# Patient Record
Sex: Male | Born: 1965 | Race: White | Hispanic: No | Marital: Married | Smoking: Current every day smoker
Health system: Southern US, Community
[De-identification: ages and names within clinical notes are randomized; demographics above are authoritative.]

## PROBLEM LIST (undated history)

## (undated) HISTORY — PX: BACK SURGERY: SHX140

---

## 2019-06-18 ENCOUNTER — Encounter (HOSPITAL_COMMUNITY): Payer: Self-pay | Admitting: Emergency Medicine

## 2019-06-18 ENCOUNTER — Emergency Department (HOSPITAL_COMMUNITY): Payer: BC Managed Care – PPO

## 2019-06-18 ENCOUNTER — Emergency Department (HOSPITAL_COMMUNITY)
Admission: EM | Admit: 2019-06-18 | Discharge: 2019-06-18 | Disposition: A | Discharge status: Home / Self Care | Payer: BC Managed Care – PPO | Attending: Emergency Medicine | Admitting: Emergency Medicine

## 2019-06-18 ENCOUNTER — Other Ambulatory Visit: Payer: Self-pay

## 2019-06-18 DIAGNOSIS — F17200 Nicotine dependence, unspecified, uncomplicated: Secondary | ICD-10-CM | POA: Diagnosis not present

## 2019-06-18 DIAGNOSIS — R079 Chest pain, unspecified: Secondary | ICD-10-CM | POA: Diagnosis not present

## 2019-06-18 DIAGNOSIS — R42 Dizziness and giddiness: Secondary | ICD-10-CM | POA: Diagnosis not present

## 2019-06-18 DIAGNOSIS — M549 Dorsalgia, unspecified: Secondary | ICD-10-CM | POA: Insufficient documentation

## 2019-06-18 LAB — BASIC METABOLIC PANEL
Anion gap: 11 (ref 5–15)
BUN: 12 mg/dL (ref 6–20)
CO2: 24 mmol/L (ref 22–32)
Calcium: 8.8 mg/dL — ABNORMAL LOW (ref 8.9–10.3)
Chloride: 106 mmol/L (ref 98–111)
Creatinine, Ser: 1 mg/dL (ref 0.61–1.24)
GFR calc Af Amer: >60 mL/min (ref >60)
GFR calc non Af Amer: >60 mL/min (ref >60)
Glucose, Bld: 107 mg/dL — ABNORMAL HIGH (ref 70–99)
Potassium: 3.9 mmol/L (ref 3.5–5.1)
Sodium: 141 mmol/L (ref 135–145)

## 2019-06-18 LAB — CBC
HCT: 47.9 % (ref 39.0–52.0)
Hemoglobin: 15.9 g/dL (ref 13.0–17.0)
MCH: 30.6 pg (ref 26.0–34.0)
MCHC: 33.2 g/dL (ref 30.0–36.0)
MCV: 92.3 fL (ref 80.0–100.0)
Platelets: 258 10*3/uL (ref 150–400)
RBC: 5.19 MIL/uL (ref 4.22–5.81)
RDW: 13 % (ref 11.5–15.5)
WBC: 13.6 10*3/uL — ABNORMAL HIGH (ref 4.0–10.5)
nRBC: 0 % (ref 0.0–0.2)

## 2019-06-18 LAB — TROPONIN I (HIGH SENSITIVITY)
Troponin I (High Sensitivity): 4 ng/L (ref <18)
Troponin I (High Sensitivity): 4 ng/L (ref <18)

## 2019-06-18 MED ORDER — IOHEXOL 350 MG/ML SOLN
100.0000 mL | Freq: Once | INTRAVENOUS | Status: AC | PRN
  Administered 2019-06-18: 100 mL via INTRAVENOUS

## 2019-06-18 MED ORDER — SODIUM CHLORIDE 0.9% FLUSH: 3.0000 mL | Freq: Once | INTRAVENOUS | Status: DC

## 2019-06-18 MED ORDER — SODIUM CHLORIDE 0.9 % IV BOLUS
1000.0000 mL | Freq: Once | INTRAVENOUS | Status: AC
  Administered 2019-06-18: 09:00:00 1000 mL via INTRAVENOUS

## 2019-06-18 MED ORDER — ASPIRIN 81 MG PO CHEW
324.0000 mg | CHEWABLE_TABLET | Freq: Once | ORAL | Status: AC
  Administered 2019-06-18: 324 mg via ORAL
  Filled 2019-06-18: qty 4

## 2019-06-18 NOTE — Discharge Instructions (Addendum)
You were evaluated in the Emergency Department and after careful evaluation, we did not find any emergent condition requiring admission or further testing in the hospital.  Your exam/testing today was overall reassuring.  Your second blood test did not show any heart damage.  As discussed, we recommend close follow-up with your primary care doctor and/or cardiology to discuss need for outpatient stress testing, ideally within the next 2 weeks.  Please return to the Emergency Department if you experience any worsening of your condition.  We encourage you to follow up with a primary care provider.  Thank you for allowing [place] to be a part of your care.

## 2019-06-18 NOTE — ED Notes (Signed)
Pt reports that he has had CP located in his central/left chest that radiates to his Lt neck & his Left fingers went numb once this morning. Pt has a high pain tolerance (per pt) & has had this happen in the past. He came to ED with pain rated in his chest 2/10 & his neck (which bothers him more than his chest) rated a 3/10 in pain. Pt is A/Ox4, ambulates well, independent at baseline, only reported cardiac history he reports is his mother having open heart valve surgery, plus pt does endorse smoking one pack of cigarettes a day.

## 2019-06-18 NOTE — ED Notes (Signed)
Pt waited for EDP, this RN spoke with pt about his visit summary & pt left having no questions unanswered.

## 2019-06-18 NOTE — ED Provider Notes (Signed)
[facility] Emergency Department Provider Note MRN:  [id number]  Arrival date & time: 06/18/19     Chief Complaint   Chest pain History of Present Illness   Jesse Beard is a 54 y.o. year-old male with a history of tobacco abuse presenting to the ED with chief complaint of chest pain.  Chest pain that began yesterday afternoon.  Described as a sharp pain, occasionally radiating to the back, left side neck, left arm.  Sudden onset while at rest, moderate to severe.  Continued this morning, associated with lightheadedness, palpitations, shortness of breath.  Denies nausea or vomiting.  Endorsing paresthesias and occasional numbness to the left index finger.  Review of Systems  A complete 10 system review of systems was obtained and all systems are negative except as noted in the HPI and PMH.   Patient's Health History   History reviewed. No pertinent past medical history.    No family history on file.  Social History   Socioeconomic History  . Marital status: Married    Spouse name: Not on file  . Number of children: Not on file  . Years of education: Not on file  . Highest education level: Not on file  Occupational History  . Not on file  Tobacco Use  . Smoking status: Current Every Day Smoker  Substance and Sexual Activity  . Alcohol use: Not on file  . Drug use: Not on file  . Sexual activity: Not on file  Other Topics Concern  . Not on file  Social History Narrative  . Not on file   Social Determinants of Health   Financial Resource Strain:   . Difficulty of Paying Living Expenses:   Food Insecurity:   . Worried About Charity fundraiser in the Last Year:   . Arboriculturist in the Last Year:   Transportation Needs:   . Film/video editor (Medical):   Marland Kitchen Lack of Transportation (Non-Medical):   Physical Activity:   . Days of Exercise per Week:   . Minutes of Exercise per Session:   Stress:   . Feeling of Stress :   Social  Connections:   . Frequency of Communication with Friends and Family:   . Frequency of Social Gatherings with Friends and Family:   . Attends Religious Services:   . Active Member of Clubs or Organizations:   . Attends Archivist Meetings:   Marland Kitchen Marital Status:   Intimate Partner Violence:   . Fear of Current or Ex-Partner:   . Emotionally Abused:   Marland Kitchen Physically Abused:   . Sexually Abused:      Physical Exam   Vitals:   06/18/19 0845 06/18/19 0900  BP: 105/70 114/76  Pulse: (!) 52 (!) 55  Resp: 10 15  Temp:    SpO2: 99% 99%    CONSTITUTIONAL: Well-appearing, NAD NEURO:  Alert and oriented x 3, normal and symmetric strength, normal speech, no visual field cuts, no neglect, no aphasia, subjective decreased sensation to the fingers of the left hand EYES:  eyes equal and reactive ENT/NECK:  no LAD, no JVD CARDIO: Regular rate, well-perfused, normal S1 and S2 PULM:  CTAB no wheezing or rhonchi GI/GU:  normal bowel sounds, non-distended, non-tender MSK/SPINE:  No gross deformities, no edema SKIN:  no rash, atraumatic PSYCH:  Appropriate speech and behavior  *Additional and/or pertinent findings included in MDM below  Diagnostic and Interventional Summary    EKG Interpretation  Date/Time:  Monday June 18 2019 08:10:22 EDT Ventricular Rate:  64 PR Interval:  142 QRS Duration: 87 QT Interval:  412 QTC Calculation: 426 R Axis:   87 Text Interpretation: Sinus rhythm Confirmed by Kennis Carina [phone]) on 06/18/2019 8:41:45 AM      Labs Reviewed  BASIC METABOLIC PANEL - Abnormal; Notable for the following components:      Result Value   Glucose, Bld 107 (*)    Calcium 8.8 (*)    All other components within normal limits  CBC - Abnormal; Notable for the following components:   WBC 13.6 (*)    All other components within normal limits  TROPONIN I (HIGH SENSITIVITY)  TROPONIN I (HIGH SENSITIVITY)    CT Angio Chest/Abd/Pel for Dissection W and/or Wo Contrast   Final Result    CT Head Wo Contrast  Final Result    DG Chest 2 View  Final Result      Medications  sodium chloride flush (NS) 0.9 % injection 3 mL (0 mLs Intravenous Hold 06/18/19 0802)  aspirin chewable tablet 324 mg (324 mg Oral Given 06/18/19 0814)  sodium chloride 0.9 % bolus 1,000 mL (1,000 mLs Intravenous New Bag/Given 06/18/19 0841)  iohexol (OMNIPAQUE) 350 MG/ML injection 100 mL (100 mLs Intravenous Contrast Given 06/18/19 0940)     Procedures  /  Critical Care Procedures  ED Course and Medical Decision Making  I have reviewed the triage vital signs, the nursing notes, and pertinent available records from the EMR.  Pertinent labs & imaging results that were available during my care of the patient were reviewed by me and considered in my medical decision making (see below for details).     Question of ACS versus dissection in this 54 year old male with chest pain radiating to the back with question of neurological deficit to the left hand, onset yesterday.  Currently without indication for code stroke, the concern for dissection needs to be evaluated with CT imaging.  Blood pressure well controlled at this time without need for intervention.  11:39 AM update: CT imaging is unremarkable, no dissection.  Troponin is negative x2.  Patient symptoms are resolved, no longer has any paresthesias or numbness to the left hand.  Left hand symptoms have been happening intermittently for weeks to months.  Patient's heart score is 2.  Chest pain is atypical.  Shared decision-making was utilized, and we agreed on close outpatient follow-up with PCP or cardiology rather than observation admission.  Strict return precautions.  [name]. Pilar Plate, MD [facility] Emergency Medicine [facility] [email or web address]  Final Clinical Impressions(s) / ED Diagnoses     ICD-10-CM   1. Chest pain, unspecified type  R07.9     ED Discharge Orders    None       Discharge Instructions  Discussed with and Provided to Patient:     Discharge Instructions     You were evaluated in the Emergency Department and after careful evaluation, we did not find any emergent condition requiring admission or further testing in the hospital.  Your exam/testing today was overall reassuring.  Your second blood test did not show any heart damage.  As discussed, we recommend close follow-up with your primary care doctor and/or cardiology to discuss need for outpatient stress testing, ideally within the next 2 weeks.  Please return to the Emergency Department if you experience any worsening of your condition.  We encourage you to follow up with a primary care provider.  Thank you for allowing [place] to  be a part of your care.        Sabas Sous, MD 06/18/19 1140

## 2019-06-18 NOTE — ED Notes (Signed)
Pt transported to CT at this time.

## 2019-06-18 NOTE — ED Triage Notes (Signed)
Pt here with c/o center  chest pain that radiates to his back , no nausea , some slight sob , pt does smoke daily

## 2021-01-16 IMAGING — CT CT ANGIO CHEST-ABD-PELV FOR DISSECTION W/ AND WO/W CM
2 of 7 series · 13 of 46 positions shown, 15 images · IV contrast (OMNI)
Comparison: None.

CLINICAL DATA: Chest pain radiating to back

EXAM:
CT ANGIOGRAPHY CHEST, ABDOMEN AND PELVIS
TECHNIQUE: Multidetector CT imaging through the chest, abdomen and pelvis was
performed using the standard protocol during bolus administration of
intravenous contrast. Multiplanar reconstructed images and MIPs were
obtained and reviewed to evaluate the vascular anatomy.
CONTRAST:  100mL OMNIPAQUE IOHEXOL 350 MG/ML SOLN

[Series 7: dissection 3.0 i30f 3 · axial · 0.74mm/px · z∈[+526,+1123]mm · 10 of 227 slices shown, 12 images]
[im 14/227  soft-tissue]
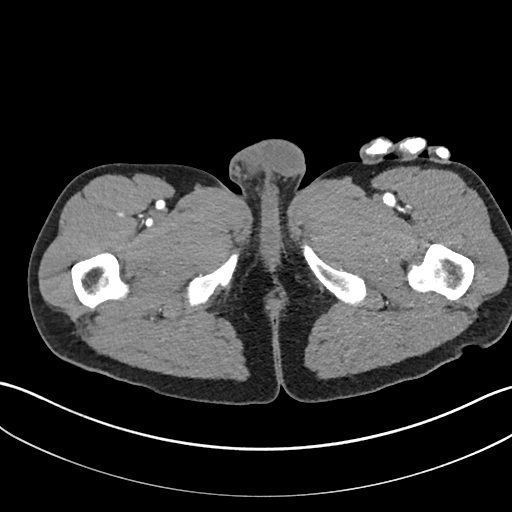
[im 14/227  bone]
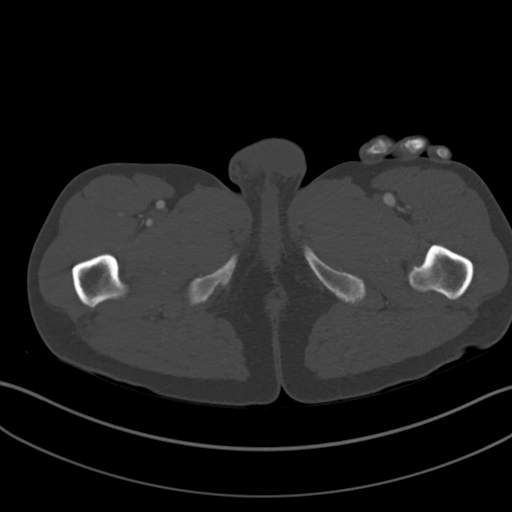
[im 40/227  soft-tissue]
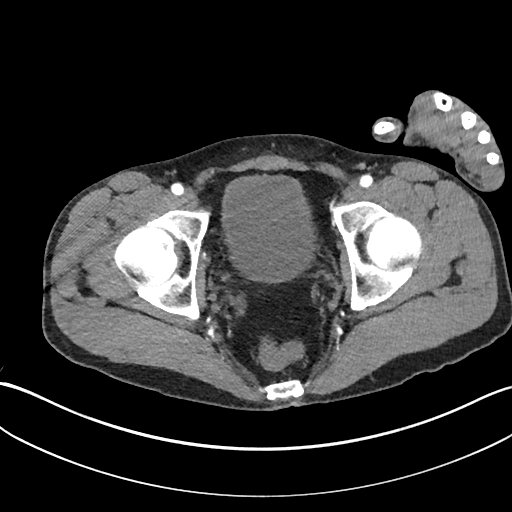
[im 67/227  soft-tissue]
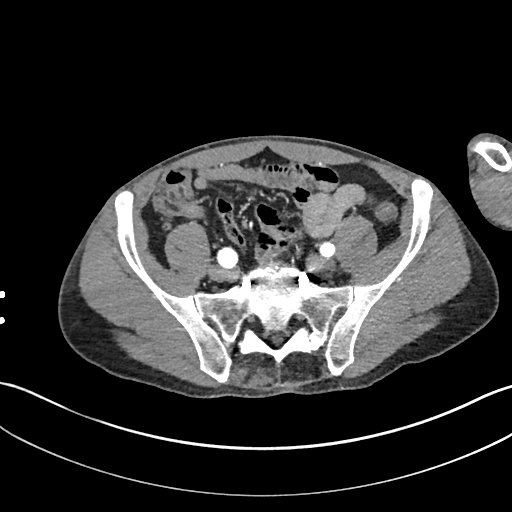
[im 80/227  soft-tissue]
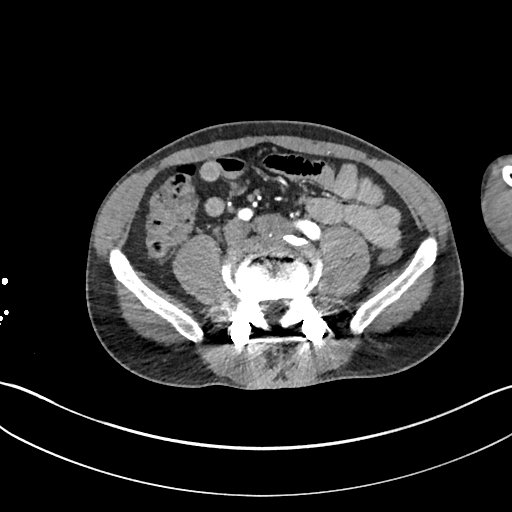
[im 107/227  soft-tissue]
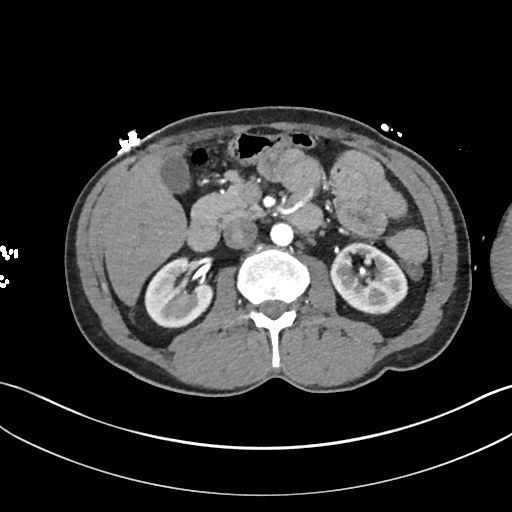
[im 120/227  soft-tissue]
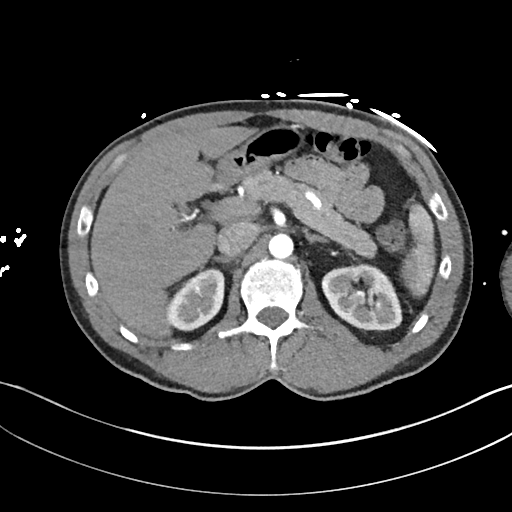
[im 147/227  soft-tissue]
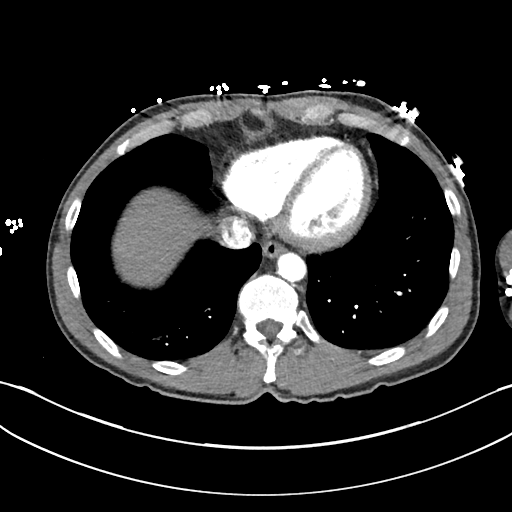
[im 173/227  soft-tissue]
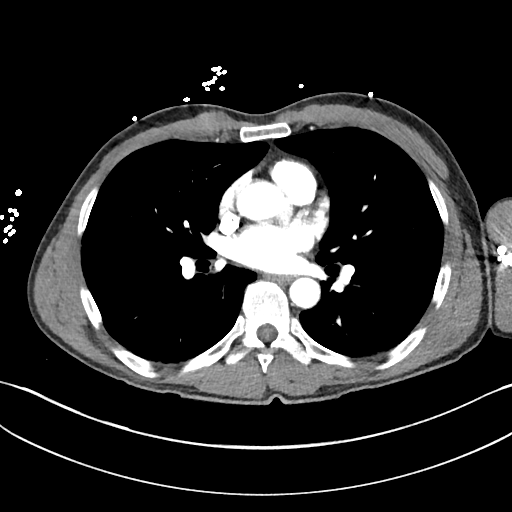
[im 187/227  soft-tissue]
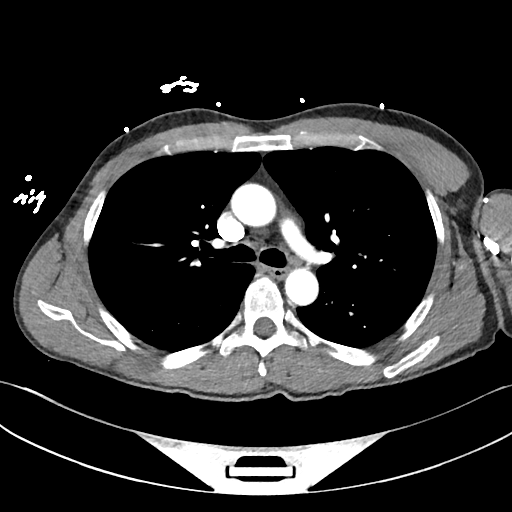
[im 187/227  bone]
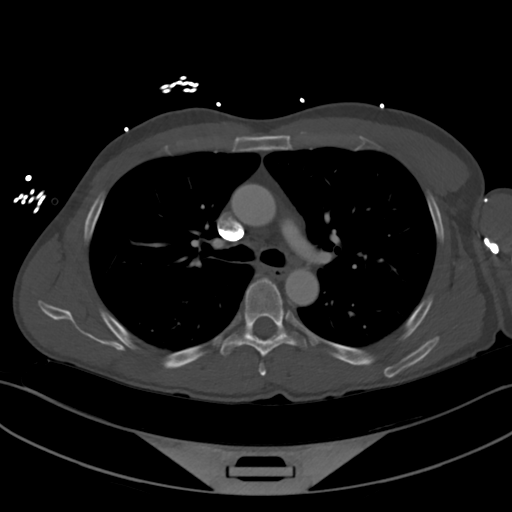
[im 213/227  soft-tissue]
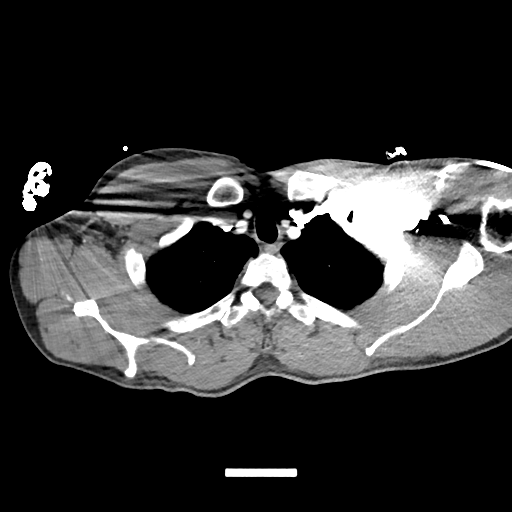

[Series 10: coronals · coronal · 0.67mm/px · 3 of 146 slices shown]
[im 37/146  soft-tissue]
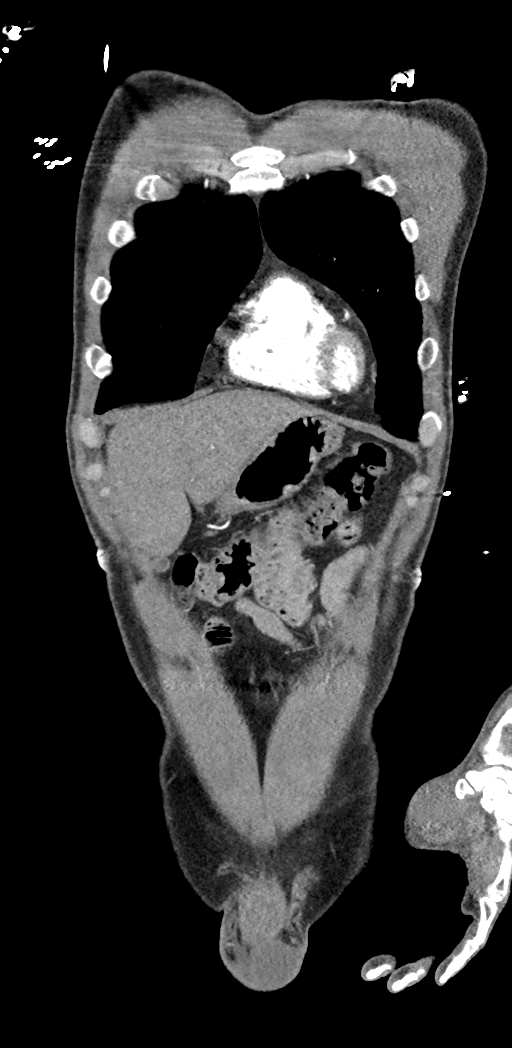
[im 73/146  soft-tissue]
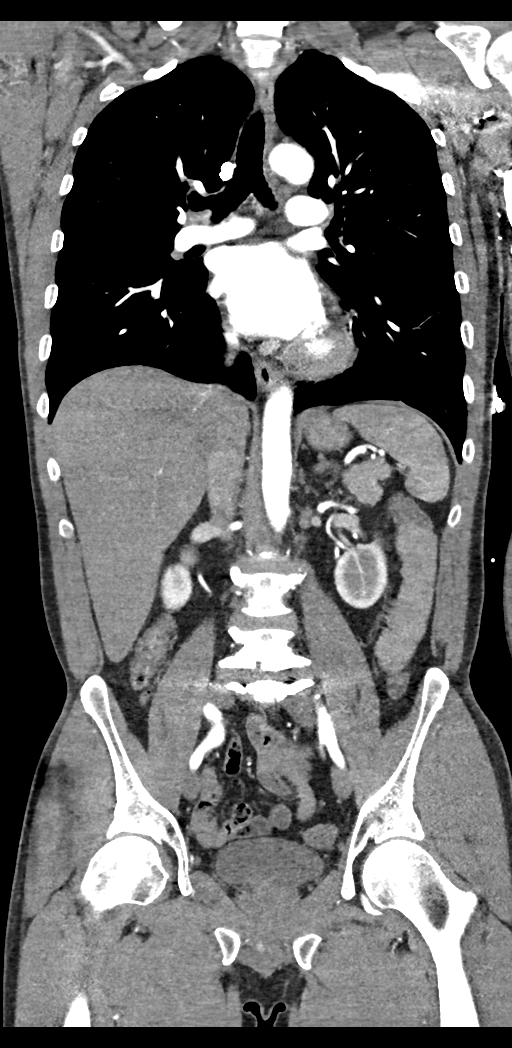
[im 109/146  soft-tissue]
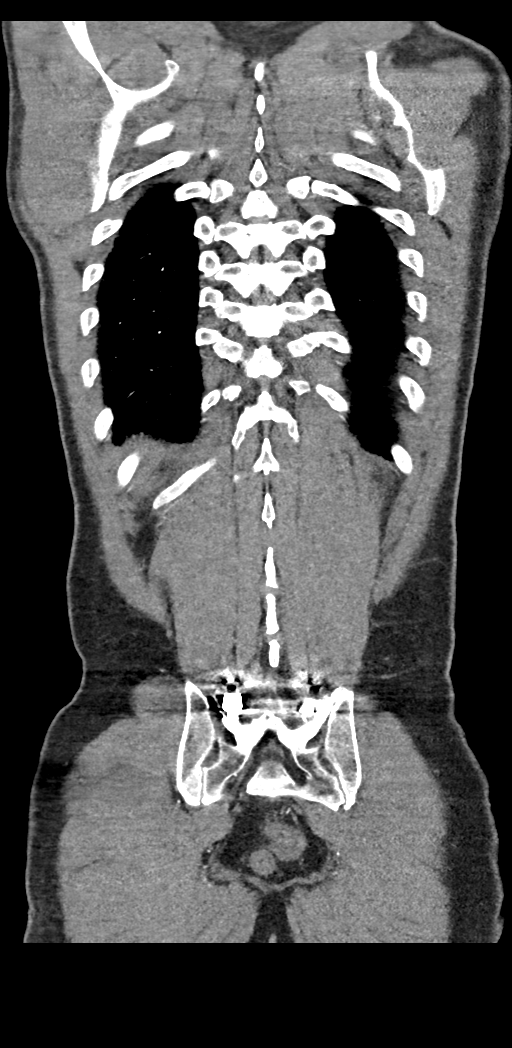

[13 of 46 positions shown; findings below may reference images not displayed]

FINDINGS: CTA CHEST FINDINGS

Cardiovascular: There is no evidence of intramural hematoma or
aortic dissection. Thoracic aorta is normal caliber. There is no
central pulmonary embolus. Heart size is normal. No pericardial
effusion.

Mediastinum/Nodes: No enlarged lymph nodes. Thyroid and esophagus
are unremarkable.

Lungs/Pleura: Mild centrilobular and paraseptal emphysema in the
upper lungs. No consolidation or mass. No pleural effusion or
pneumothorax.

Musculoskeletal: Chronic right rib fractures. Acute osseous
abnormality.

Review of the MIP images confirms the above findings.

CTA ABDOMEN AND PELVIS FINDINGS

VASCULAR

Aorta: Normal caliber aorta without aneurysm, dissection, vasculitis
or significant stenosis. Minimal calcified plaque.

Celiac: Patent.

SMA: Originates from celiac trunk.  Patent.

Renals: Patent.

IMA: Patent.

Inflow: Minimal calcified plaque.  Patent.

Veins: Not well evaluated

Review of the MIP images confirms the above findings.

NON-VASCULAR

Hepatobiliary: No focal liver abnormality. Gallbladder is
unremarkable. No biliary dilatation.

Pancreas: Unremarkable.

Spleen: Unremarkable arterial phase appearance.

Adrenals/Urinary Tract: Adrenals, kidneys, and bladder are
unremarkable.

Stomach/Bowel: Stomach is within normal limits. Bowel is normal in
caliber. Mild colonic diverticulosis. Normal appendix.

Lymphatic: No enlarged lymph nodes.

Reproductive: Prostate is unremarkable.

Other: No abdominal wall hernia or abnormality. No abdominopelvic
ascites.

Musculoskeletal: Degenerative changes the lower lumbar spine. There
are postoperative changes at L5-S1.

Review of the MIP images confirms the above findings.
IMPRESSION: No evidence of aortic dissection or other acute abnormality.

Mild emphysema.

Mild aortic atherosclerosis.
